# Patient Record
Sex: Male | Born: 1975 | Race: White | Hispanic: No | Marital: Married | Smoking: Current every day smoker
Health system: Southern US, Community
[De-identification: ages and names within clinical notes are randomized; demographics above are authoritative.]

## PROBLEM LIST (undated history)

## (undated) DIAGNOSIS — R51 Headache: Secondary | ICD-10-CM

## (undated) DIAGNOSIS — T4145XA Adverse effect of unspecified anesthetic, initial encounter: Secondary | ICD-10-CM

## (undated) DIAGNOSIS — Z9889 Other specified postprocedural states: Secondary | ICD-10-CM

## (undated) DIAGNOSIS — G709 Myoneural disorder, unspecified: Secondary | ICD-10-CM

## (undated) DIAGNOSIS — R112 Nausea with vomiting, unspecified: Secondary | ICD-10-CM

## (undated) DIAGNOSIS — M1990 Unspecified osteoarthritis, unspecified site: Secondary | ICD-10-CM

## (undated) DIAGNOSIS — K219 Gastro-esophageal reflux disease without esophagitis: Secondary | ICD-10-CM

## (undated) DIAGNOSIS — F419 Anxiety disorder, unspecified: Secondary | ICD-10-CM

## (undated) DIAGNOSIS — R519 Headache, unspecified: Secondary | ICD-10-CM

## (undated) DIAGNOSIS — T8859XA Other complications of anesthesia, initial encounter: Secondary | ICD-10-CM

## (undated) HISTORY — PX: APPENDECTOMY: SHX54

---

## 2000-05-15 HISTORY — PX: NASAL RECONSTRUCTION: SHX2069

## 2010-05-15 HISTORY — PX: HERNIA REPAIR: SHX51

## 2015-01-12 ENCOUNTER — Other Ambulatory Visit: Payer: Self-pay | Admitting: Neurological Surgery

## 2015-01-27 ENCOUNTER — Other Ambulatory Visit (HOSPITAL_COMMUNITY): Payer: Self-pay | Admitting: *Deleted

## 2015-01-28 ENCOUNTER — Encounter (HOSPITAL_COMMUNITY)
Admission: RE | Admit: 2015-01-28 | Discharge: 2015-01-28 | Disposition: A | Discharge status: Home / Self Care | Payer: BLUE CROSS/BLUE SHIELD | Source: Ambulatory Visit | Attending: Neurological Surgery | Admitting: Neurological Surgery

## 2015-01-28 ENCOUNTER — Encounter (HOSPITAL_COMMUNITY): Payer: Self-pay

## 2015-01-28 DIAGNOSIS — Z01812 Encounter for preprocedural laboratory examination: Secondary | ICD-10-CM | POA: Diagnosis present

## 2015-01-28 DIAGNOSIS — Z0183 Encounter for blood typing: Secondary | ICD-10-CM | POA: Insufficient documentation

## 2015-01-28 DIAGNOSIS — M4310 Spondylolisthesis, site unspecified: Secondary | ICD-10-CM | POA: Insufficient documentation

## 2015-01-28 HISTORY — DX: Headache, unspecified: R51.9

## 2015-01-28 HISTORY — DX: Gastro-esophageal reflux disease without esophagitis: K21.9

## 2015-01-28 HISTORY — DX: Myoneural disorder, unspecified: G70.9

## 2015-01-28 HISTORY — DX: Unspecified osteoarthritis, unspecified site: M19.90

## 2015-01-28 HISTORY — DX: Headache: R51

## 2015-01-28 HISTORY — DX: Other specified postprocedural states: Z98.890

## 2015-01-28 HISTORY — DX: Adverse effect of unspecified anesthetic, initial encounter: T41.45XA

## 2015-01-28 HISTORY — DX: Other complications of anesthesia, initial encounter: T88.59XA

## 2015-01-28 HISTORY — DX: Anxiety disorder, unspecified: F41.9

## 2015-01-28 HISTORY — DX: Nausea with vomiting, unspecified: R11.2

## 2015-01-28 LAB — CBC
HEMATOCRIT: 41 % (ref 39.0–52.0)
HEMOGLOBIN: 14.4 g/dL (ref 13.0–17.0)
MCH: 30.4 pg (ref 26.0–34.0)
MCHC: 35.1 g/dL (ref 30.0–36.0)
MCV: 86.5 fL (ref 78.0–100.0)
Platelets: 211 10*3/uL (ref 150–400)
RBC: 4.74 MIL/uL (ref 4.22–5.81)
RDW: 12.4 % (ref 11.5–15.5)
WBC: 6 10*3/uL (ref 4.0–10.5)

## 2015-01-28 LAB — COMPREHENSIVE METABOLIC PANEL
ALBUMIN: 4.2 g/dL (ref 3.5–5.0)
ALT: 34 U/L (ref 17–63)
ANION GAP: 6 (ref 5–15)
AST: 24 U/L (ref 15–41)
Alkaline Phosphatase: 81 U/L (ref 38–126)
BILIRUBIN TOTAL: 1.3 mg/dL — AB (ref 0.3–1.2)
BUN: 6 mg/dL (ref 6–20)
CO2: 27 mmol/L (ref 22–32)
Calcium: 9 mg/dL (ref 8.9–10.3)
Chloride: 107 mmol/L (ref 101–111)
Creatinine, Ser: 0.84 mg/dL (ref 0.61–1.24)
GFR calc Af Amer: >60 mL/min (ref >60)
GFR calc non Af Amer: >60 mL/min (ref >60)
GLUCOSE: 92 mg/dL (ref 65–99)
POTASSIUM: 3.9 mmol/L (ref 3.5–5.1)
SODIUM: 140 mmol/L (ref 135–145)
TOTAL PROTEIN: 7.3 g/dL (ref 6.5–8.1)

## 2015-01-28 LAB — TYPE AND SCREEN
ABO/RH(D): B POS
ANTIBODY SCREEN: NEGATIVE

## 2015-01-28 LAB — SURGICAL PCR SCREEN
MRSA, PCR: NEGATIVE
STAPHYLOCOCCUS AUREUS: NEGATIVE

## 2015-01-28 LAB — ABO/RH: ABO/RH(D): B POS

## 2015-01-28 NOTE — Pre-Procedure Instructions (Signed)
Gregory Sandoval  01/28/2015      [facility] - [place], [place] - [place] [address] [place] [address] Phone: [phone] Fax: [phone]    Your procedure is scheduled on 02/08/2015  Report to [facility] Admitting at 5:30 A.M.  Call this number if you have problems the morning of surgery:  [phone]   Remember:  Do not eat food or drink liquids after midnight.  On .  Place clean sheets on your  bed the night of your first shower and do not sleep with pets.  Day of Surgery  Do not apply any lotions/deodorants the morning of surgery.  Please wear clean clothes to the hospital/surgery center.  Please read over the following fact sheets that you were given. Pain Booklet, Coughing and Deep Breathing, Blood Transfusion Information, MRSA Information and Surgical Site Infection Prevention

## 2015-01-28 NOTE — Progress Notes (Addendum)
Pt. Was seen at [facility] in [place], [place] for Echo. 11/2013 , states that he had this test for the stress he was feeling , relative to chest pain.

## 2015-01-28 NOTE — Progress Notes (Signed)
PCP- Daganhart in [facility] in [place]. Pt. Reports that he feels chest pain "most of the time & that is why I ask for an Echo every once in a while, just to check on how my heart is doing".

## 2015-01-28 NOTE — Progress Notes (Signed)
Has been with pain management in the past in [place], on & off on Norco- for 10-31yrs. Pt. Reports that he has narcotics "stashed if I need it".

## 2015-02-07 ENCOUNTER — Encounter (HOSPITAL_COMMUNITY): Payer: Self-pay | Admitting: Certified Registered Nurse Anesthetist

## 2015-02-08 ENCOUNTER — Inpatient Hospital Stay (HOSPITAL_COMMUNITY): Payer: BLUE CROSS/BLUE SHIELD

## 2015-02-08 ENCOUNTER — Encounter (HOSPITAL_COMMUNITY)
Admission: RE | Disposition: A | Discharge status: Home / Self Care | Payer: BLUE CROSS/BLUE SHIELD | Source: Ambulatory Visit | Attending: Neurological Surgery

## 2015-02-08 ENCOUNTER — Encounter (HOSPITAL_COMMUNITY): Payer: Self-pay | Admitting: Emergency Medicine

## 2015-02-08 ENCOUNTER — Inpatient Hospital Stay (HOSPITAL_COMMUNITY): Payer: BLUE CROSS/BLUE SHIELD | Admitting: Certified Registered Nurse Anesthetist

## 2015-02-08 ENCOUNTER — Inpatient Hospital Stay (HOSPITAL_COMMUNITY)
Admission: RE | Admit: 2015-02-08 | Discharge: 2015-02-10 | DRG: 460 | Disposition: A | Discharge status: Home / Self Care | Payer: BLUE CROSS/BLUE SHIELD | Source: Ambulatory Visit | Attending: Neurological Surgery | Admitting: Neurological Surgery

## 2015-02-08 DIAGNOSIS — M549 Dorsalgia, unspecified: Secondary | ICD-10-CM | POA: Diagnosis present

## 2015-02-08 DIAGNOSIS — M5116 Intervertebral disc disorders with radiculopathy, lumbar region: Secondary | ICD-10-CM | POA: Diagnosis present

## 2015-02-08 DIAGNOSIS — M4316 Spondylolisthesis, lumbar region: Secondary | ICD-10-CM | POA: Diagnosis present

## 2015-02-08 DIAGNOSIS — Z419 Encounter for procedure for purposes other than remedying health state, unspecified: Secondary | ICD-10-CM

## 2015-02-08 SURGERY — POSTERIOR LUMBAR FUSION 1 LEVEL
Anesthesia: General | Site: Back

## 2015-02-08 MED ORDER — STERILE WATER FOR INJECTION IJ SOLN
INTRAMUSCULAR | Status: AC
  Filled 2015-02-08: qty 10

## 2015-02-08 MED ORDER — MIDAZOLAM HCL 2 MG/2ML IJ SOLN
INTRAMUSCULAR | Status: AC
  Filled 2015-02-08: qty 4

## 2015-02-08 MED ORDER — SODIUM CHLORIDE 0.9 % IJ SOLN
3.0000 mL | Freq: Two times a day (BID) | INTRAMUSCULAR | Status: DC
  Administered 2015-02-09 – 2015-02-10 (×3): 3 mL via INTRAVENOUS

## 2015-02-08 MED ORDER — SODIUM CHLORIDE 0.9 % IV SOLN: INTRAVENOUS | Status: DC

## 2015-02-08 MED ORDER — ONDANSETRON HCL 4 MG/2ML IJ SOLN: 4.0000 mg | INTRAMUSCULAR | Status: DC | PRN

## 2015-02-08 MED ORDER — MIDAZOLAM HCL 5 MG/5ML IJ SOLN
INTRAMUSCULAR | Status: DC | PRN
Start: 2015-02-08 — End: 2015-02-08
  Administered 2015-02-08: 2 mg via INTRAVENOUS

## 2015-02-08 MED ORDER — HYDROCODONE-ACETAMINOPHEN 10-325 MG PO TABS: 1.0000 | ORAL_TABLET | Freq: Four times a day (QID) | ORAL | Status: DC | PRN

## 2015-02-08 MED ORDER — LIDOCAINE HCL (CARDIAC) 20 MG/ML IV SOLN
INTRAVENOUS | Status: DC | PRN
  Administered 2015-02-08: 100 mg via INTRAVENOUS

## 2015-02-08 MED ORDER — DEXAMETHASONE SODIUM PHOSPHATE 10 MG/ML IJ SOLN
INTRAMUSCULAR | Status: AC
  Filled 2015-02-08: qty 1

## 2015-02-08 MED ORDER — SURGIFOAM 100 EX MISC
CUTANEOUS | Status: DC | PRN
  Administered 2015-02-08: 08:00:00 via TOPICAL

## 2015-02-08 MED ORDER — LACTATED RINGERS IV SOLN
INTRAVENOUS | Status: DC | PRN
  Administered 2015-02-08 (×3): via INTRAVENOUS

## 2015-02-08 MED ORDER — PROMETHAZINE HCL 25 MG/ML IJ SOLN
6.2500 mg | INTRAMUSCULAR | Status: DC | PRN
  Administered 2015-02-08: 6.25 mg via INTRAVENOUS

## 2015-02-08 MED ORDER — SUFENTANIL CITRATE 50 MCG/ML IV SOLN
INTRAVENOUS | Status: DC | PRN
  Administered 2015-02-08 (×4): 5 ug via INTRAVENOUS

## 2015-02-08 MED ORDER — ACETAMINOPHEN 325 MG PO TABS: 650.0000 mg | ORAL_TABLET | ORAL | Status: DC | PRN

## 2015-02-08 MED ORDER — HYDROMORPHONE HCL 1 MG/ML IJ SOLN: 0.2500 mg | INTRAMUSCULAR | Status: DC | PRN

## 2015-02-08 MED ORDER — SUCCINYLCHOLINE CHLORIDE 20 MG/ML IJ SOLN
INTRAMUSCULAR | Status: AC
  Filled 2015-02-08: qty 1

## 2015-02-08 MED ORDER — CEFAZOLIN SODIUM 1-5 GM-% IV SOLN
1.0000 g | Freq: Three times a day (TID) | INTRAVENOUS | Status: AC
  Administered 2015-02-08 (×2): 1 g via INTRAVENOUS
  Filled 2015-02-08 (×2): qty 50

## 2015-02-08 MED ORDER — SUFENTANIL CITRATE 50 MCG/ML IV SOLN
INTRAVENOUS | Status: AC
  Filled 2015-02-08: qty 1

## 2015-02-08 MED ORDER — BUPIVACAINE HCL (PF) 0.5 % IJ SOLN
INTRAMUSCULAR | Status: DC | PRN
  Administered 2015-02-08: 10 mL
  Administered 2015-02-08: 25 mL

## 2015-02-08 MED ORDER — HYDROCODONE-ACETAMINOPHEN 5-325 MG PO TABS
1.0000 | ORAL_TABLET | ORAL | Status: DC | PRN
  Filled 2015-02-08: qty 1

## 2015-02-08 MED ORDER — ROCURONIUM BROMIDE 50 MG/5ML IV SOLN
INTRAVENOUS | Status: AC
  Filled 2015-02-08: qty 3

## 2015-02-08 MED ORDER — ONDANSETRON HCL 4 MG/2ML IJ SOLN
INTRAMUSCULAR | Status: DC | PRN
  Administered 2015-02-08: 4 mg via INTRAVENOUS

## 2015-02-08 MED ORDER — PROPOFOL 10 MG/ML IV BOLUS
INTRAVENOUS | Status: AC
  Filled 2015-02-08: qty 20

## 2015-02-08 MED ORDER — BISACODYL 10 MG RE SUPP: 10.0000 mg | Freq: Every day | RECTAL | Status: DC | PRN

## 2015-02-08 MED ORDER — SUCCINYLCHOLINE CHLORIDE 20 MG/ML IJ SOLN
INTRAMUSCULAR | Status: DC | PRN
  Administered 2015-02-08: 120 mg via INTRAVENOUS

## 2015-02-08 MED ORDER — METHOCARBAMOL 500 MG PO TABS
500.0000 mg | ORAL_TABLET | Freq: Four times a day (QID) | ORAL | Status: DC | PRN
  Administered 2015-02-08 – 2015-02-09 (×3): 500 mg via ORAL
  Filled 2015-02-08 (×3): qty 1

## 2015-02-08 MED ORDER — ALPRAZOLAM 0.5 MG PO TABS
1.0000 mg | ORAL_TABLET | Freq: Three times a day (TID) | ORAL | Status: DC | PRN
  Administered 2015-02-08 – 2015-02-09 (×2): 1 mg via ORAL
  Filled 2015-02-08 (×2): qty 2

## 2015-02-08 MED ORDER — EPHEDRINE SULFATE 50 MG/ML IJ SOLN
INTRAMUSCULAR | Status: AC
  Filled 2015-02-08: qty 1

## 2015-02-08 MED ORDER — SUGAMMADEX SODIUM 500 MG/5ML IV SOLN
INTRAVENOUS | Status: AC
  Filled 2015-02-08: qty 5

## 2015-02-08 MED ORDER — SENNA 8.6 MG PO TABS
1.0000 | ORAL_TABLET | Freq: Two times a day (BID) | ORAL | Status: DC
  Administered 2015-02-10: 8.6 mg via ORAL
  Filled 2015-02-08 (×3): qty 1

## 2015-02-08 MED ORDER — CEFAZOLIN SODIUM-DEXTROSE 2-3 GM-% IV SOLR
2.0000 g | INTRAVENOUS | Status: AC
  Administered 2015-02-08: 3 g via INTRAVENOUS

## 2015-02-08 MED ORDER — PANTOPRAZOLE SODIUM 40 MG PO TBEC
40.0000 mg | DELAYED_RELEASE_TABLET | Freq: Every day | ORAL | Status: DC
  Administered 2015-02-09 – 2015-02-10 (×2): 40 mg via ORAL
  Filled 2015-02-08 (×2): qty 1

## 2015-02-08 MED ORDER — ROCURONIUM BROMIDE 50 MG/5ML IV SOLN
INTRAVENOUS | Status: AC
  Filled 2015-02-08: qty 1

## 2015-02-08 MED ORDER — PHENOL 1.4 % MT LIQD: 1.0000 | OROMUCOSAL | Status: DC | PRN

## 2015-02-08 MED ORDER — PROPOFOL 10 MG/ML IV BOLUS
INTRAVENOUS | Status: DC | PRN
  Administered 2015-02-08: 70 mg via INTRAVENOUS
  Administered 2015-02-08: 20 mg via INTRAVENOUS
  Administered 2015-02-08: 200 mg via INTRAVENOUS

## 2015-02-08 MED ORDER — 0.9 % SODIUM CHLORIDE (POUR BTL) OPTIME
TOPICAL | Status: DC | PRN
  Administered 2015-02-08: 1000 mL

## 2015-02-08 MED ORDER — DOCUSATE SODIUM 100 MG PO CAPS
100.0000 mg | ORAL_CAPSULE | Freq: Two times a day (BID) | ORAL | Status: DC
  Administered 2015-02-09 – 2015-02-10 (×2): 100 mg via ORAL
  Filled 2015-02-08 (×3): qty 1

## 2015-02-08 MED ORDER — ACETAMINOPHEN 650 MG RE SUPP: 650.0000 mg | RECTAL | Status: DC | PRN

## 2015-02-08 MED ORDER — ROCURONIUM BROMIDE 100 MG/10ML IV SOLN
INTRAVENOUS | Status: DC | PRN
  Administered 2015-02-08 (×2): 50 mg via INTRAVENOUS
  Administered 2015-02-08: 40 mg via INTRAVENOUS

## 2015-02-08 MED ORDER — FENTANYL CITRATE (PF) 100 MCG/2ML IJ SOLN
INTRAMUSCULAR | Status: DC | PRN
  Administered 2015-02-08: 150 ug via INTRAVENOUS
  Administered 2015-02-08: 100 ug via INTRAVENOUS

## 2015-02-08 MED ORDER — OXYCODONE-ACETAMINOPHEN 5-325 MG PO TABS
1.0000 | ORAL_TABLET | ORAL | Status: DC | PRN
Start: 2015-02-08 — End: 2015-02-10
  Administered 2015-02-08 – 2015-02-10 (×10): 2 via ORAL
  Filled 2015-02-08 (×11): qty 2

## 2015-02-08 MED ORDER — FLEET ENEMA 7-19 GM/118ML RE ENEM: 1.0000 | ENEMA | Freq: Once | RECTAL | Status: DC | PRN

## 2015-02-08 MED ORDER — METHOCARBAMOL 1000 MG/10ML IJ SOLN
500.0000 mg | Freq: Four times a day (QID) | INTRAVENOUS | Status: DC | PRN
  Filled 2015-02-08: qty 5

## 2015-02-08 MED ORDER — FENTANYL CITRATE (PF) 250 MCG/5ML IJ SOLN
INTRAMUSCULAR | Status: AC
  Filled 2015-02-08: qty 5

## 2015-02-08 MED ORDER — THROMBIN 5000 UNITS EX SOLR
OROMUCOSAL | Status: DC | PRN
  Administered 2015-02-08: 08:00:00 via TOPICAL

## 2015-02-08 MED ORDER — SUGAMMADEX SODIUM 500 MG/5ML IV SOLN
INTRAVENOUS | Status: DC | PRN
  Administered 2015-02-08: 500 mg via INTRAVENOUS

## 2015-02-08 MED ORDER — PROMETHAZINE HCL 25 MG/ML IJ SOLN
INTRAMUSCULAR | Status: AC
  Filled 2015-02-08: qty 1

## 2015-02-08 MED ORDER — SODIUM CHLORIDE 0.9 % IJ SOLN: 3.0000 mL | INTRAMUSCULAR | Status: DC | PRN

## 2015-02-08 MED ORDER — MENTHOL 3 MG MT LOZG: 1.0000 | LOZENGE | OROMUCOSAL | Status: DC | PRN

## 2015-02-08 MED ORDER — SODIUM CHLORIDE 0.9 % IV SOLN: 250.0000 mL | INTRAVENOUS | Status: DC

## 2015-02-08 MED ORDER — DEXAMETHASONE SODIUM PHOSPHATE 4 MG/ML IJ SOLN
INTRAMUSCULAR | Status: DC | PRN
  Administered 2015-02-08: 10 mg via INTRAVENOUS

## 2015-02-08 MED ORDER — SODIUM CHLORIDE 0.9 % IR SOLN
Status: DC | PRN
  Administered 2015-02-08: 08:00:00

## 2015-02-08 MED ORDER — LIDOCAINE-EPINEPHRINE 1 %-1:100000 IJ SOLN
INTRAMUSCULAR | Status: DC | PRN
  Administered 2015-02-08: 10 mL

## 2015-02-08 MED ORDER — POLYETHYLENE GLYCOL 3350 17 G PO PACK: 17.0000 g | PACK | Freq: Every day | ORAL | Status: DC | PRN

## 2015-02-08 MED FILL — Heparin Sodium (Porcine) Inj 1000 Unit/ML: INTRAMUSCULAR | Qty: 30 | Status: AC

## 2015-02-08 MED FILL — Sodium Chloride IV Soln 0.9%: INTRAVENOUS | Qty: 1000 | Status: AC

## 2015-02-08 SURGICAL SUPPLY — 62 items
BAG DECANTER FOR FLEXI CONT (MISCELLANEOUS) ×3 IMPLANT
BLADE CLIPPER SURG (BLADE) IMPLANT
BONE MATRIX OSTEOCEL PRO MED (Bone Implant) ×3 IMPLANT
BUR MATCHSTICK NEURO 3.0 LAGG (BURR) ×3 IMPLANT
CAGE COROENT LRG MP 8X9X28-12 (Cage) ×6 IMPLANT
CANISTER SUCT 3000ML PPV (MISCELLANEOUS) ×3 IMPLANT
CONT SPEC 4OZ CLIKSEAL STRL BL (MISCELLANEOUS) ×3 IMPLANT
COVER BACK TABLE 60X90IN (DRAPES) ×3 IMPLANT
DECANTER SPIKE VIAL GLASS SM (MISCELLANEOUS) ×3 IMPLANT
DERMABOND ADVANCED (GAUZE/BANDAGES/DRESSINGS) ×2
DERMABOND ADVANCED .7 DNX12 (GAUZE/BANDAGES/DRESSINGS) ×1 IMPLANT
DRAPE C-ARM 42X72 X-RAY (DRAPES) ×6 IMPLANT
DRAPE LAPAROTOMY 100X72X124 (DRAPES) ×3 IMPLANT
DRAPE POUCH INSTRU U-SHP 10X18 (DRAPES) ×3 IMPLANT
DRAPE PROXIMA HALF (DRAPES) ×3 IMPLANT
DRSG OPSITE POSTOP 4X6 (GAUZE/BANDAGES/DRESSINGS) ×3 IMPLANT
DURAPREP 26ML APPLICATOR (WOUND CARE) ×3 IMPLANT
ELECT BLADE 4.0 EZ CLEAN MEGAD (MISCELLANEOUS) ×3
ELECT REM PT RETURN 9FT ADLT (ELECTROSURGICAL) ×3
ELECTRODE BLDE 4.0 EZ CLN MEGD (MISCELLANEOUS) ×1 IMPLANT
ELECTRODE REM PT RTRN 9FT ADLT (ELECTROSURGICAL) ×1 IMPLANT
GAUZE SPONGE 4X4 12PLY STRL (GAUZE/BANDAGES/DRESSINGS) ×3 IMPLANT
GAUZE SPONGE 4X4 16PLY XRAY LF (GAUZE/BANDAGES/DRESSINGS) ×3 IMPLANT
GLOVE BIOGEL PI IND STRL 8.5 (GLOVE) ×2 IMPLANT
GLOVE BIOGEL PI INDICATOR 8.5 (GLOVE) ×4
GLOVE ECLIPSE 8.5 STRL (GLOVE) ×6 IMPLANT
GLOVE EXAM NITRILE LRG STRL (GLOVE) IMPLANT
GLOVE EXAM NITRILE MD LF STRL (GLOVE) IMPLANT
GLOVE EXAM NITRILE XL STR (GLOVE) IMPLANT
GLOVE EXAM NITRILE XS STR PU (GLOVE) IMPLANT
GOWN STRL REUS W/ TWL LRG LVL3 (GOWN DISPOSABLE) IMPLANT
GOWN STRL REUS W/ TWL XL LVL3 (GOWN DISPOSABLE) IMPLANT
GOWN STRL REUS W/TWL 2XL LVL3 (GOWN DISPOSABLE) ×6 IMPLANT
GOWN STRL REUS W/TWL LRG LVL3 (GOWN DISPOSABLE)
GOWN STRL REUS W/TWL XL LVL3 (GOWN DISPOSABLE)
HEMOSTAT POWDER KIT SURGIFOAM (HEMOSTASIS) IMPLANT
HEMOSTAT POWDER SURGIFOAM 1G (HEMOSTASIS) ×3 IMPLANT
KIT BASIN OR (CUSTOM PROCEDURE TRAY) ×3 IMPLANT
KIT ROOM TURNOVER OR (KITS) ×3 IMPLANT
MILL MEDIUM DISP (BLADE) ×3 IMPLANT
NEEDLE HYPO 22GX1.5 SAFETY (NEEDLE) ×3 IMPLANT
NS IRRIG 1000ML POUR BTL (IV SOLUTION) ×3 IMPLANT
PACK FOAM VITOSS 10CC (Orthopedic Implant) ×3 IMPLANT
PACK LAMINECTOMY NEURO (CUSTOM PROCEDURE TRAY) ×3 IMPLANT
PAD ARMBOARD 7.5X6 YLW CONV (MISCELLANEOUS) ×9 IMPLANT
PATTIES SURGICAL .5 X1 (DISPOSABLE) ×3 IMPLANT
ROD RELINE-O LORD 5.5X40 (Rod) ×6 IMPLANT
SCREW LOCK RELINE 5.5 TULIP (Screw) ×12 IMPLANT
SCREW RELINE-O POLY 6.5X45 (Screw) ×12 IMPLANT
SPONGE LAP 4X18 X RAY DECT (DISPOSABLE) IMPLANT
SPONGE SURGIFOAM ABS GEL 100 (HEMOSTASIS) ×3 IMPLANT
SUT VIC AB 1 CT1 18XBRD ANBCTR (SUTURE) ×1 IMPLANT
SUT VIC AB 1 CT1 8-18 (SUTURE) ×2
SUT VIC AB 2-0 CP2 18 (SUTURE) ×3 IMPLANT
SUT VIC AB 3-0 SH 8-18 (SUTURE) ×6 IMPLANT
SYR 3ML LL SCALE MARK (SYRINGE) ×12 IMPLANT
TOWEL OR 17X24 6PK STRL BLUE (TOWEL DISPOSABLE) ×3 IMPLANT
TOWEL OR 17X26 10 PK STRL BLUE (TOWEL DISPOSABLE) ×3 IMPLANT
TRAP SPECIMEN MUCOUS 40CC (MISCELLANEOUS) ×3 IMPLANT
TRAY FOLEY CATH 16FRSI W/METER (SET/KITS/TRAYS/PACK) ×3 IMPLANT
TRAY FOLEY W/METER SILVER 14FR (SET/KITS/TRAYS/PACK) IMPLANT
WATER STERILE IRR 1000ML POUR (IV SOLUTION) ×3 IMPLANT

## 2015-02-08 NOTE — Anesthesia Postprocedure Evaluation (Signed)
  Anesthesia Post-op Note  Patient: Gregory Sandoval  Procedure(s) Performed: Procedure(s) with comments: Lumbar four to five Posterior Lumbar interbody fusion (N/A) - L4-5 Posterior lumbar interbody fusion  Patient Location: PACU  Anesthesia Type:General  Level of Consciousness: awake and alert   Airway and Oxygen Therapy: Patient Spontanous Breathing  Post-op Pain: mild  Post-op Assessment: Post-op Vital signs reviewed              Post-op Vital Signs: stable  Last Vitals:  Filed Vitals:   02/08/15 1239  BP: 132/83  Pulse: 93  Temp: 36.9 C  Resp: 18    Complications: No apparent anesthesia complications

## 2015-02-08 NOTE — Anesthesia Preprocedure Evaluation (Addendum)
Anesthesia Evaluation  Patient identified by MRN, date of birth, ID band Patient awake    Reviewed: Allergy & Precautions, NPO status , Patient's Chart, lab work & pertinent test results  Airway Mallampati: III  TM Distance: >3 FB Neck ROM: Full    Dental  (+) Teeth Intact, Dental Advisory Given   Pulmonary Current Smoker,    breath sounds clear to auscultation       Cardiovascular negative cardio ROS   Rhythm:Regular Rate:Normal     Neuro/Psych  Headaches,  Neuromuscular disease    GI/Hepatic Neg liver ROS, GERD  ,  Endo/Other  negative endocrine ROSMorbid obesity  Renal/GU negative Renal ROS     Musculoskeletal   Abdominal (+) + obese,   Peds  Hematology   Anesthesia Other Findings   Reproductive/Obstetrics                            Anesthesia Physical Anesthesia Plan  ASA: II  Anesthesia Plan: General   Post-op Pain Management:    Induction: Intravenous  Airway Management Planned: Oral ETT and Video Laryngoscope Planned  Additional Equipment:   Intra-op Plan:   Post-operative Plan: Extubation in OR  Informed Consent: I have reviewed the patients History and Physical, chart, labs and discussed the procedure including the risks, benefits and alternatives for the proposed anesthesia with the patient or authorized representative who has indicated his/her understanding and acceptance.     Plan Discussed with:   Anesthesia Plan Comments:        Anesthesia Quick Evaluation

## 2015-02-08 NOTE — H&P (Signed)
CHIEF COMPLAINT:                                          Back pain, bilateral lower extremity pain with spasms in the right leg and pain in the left leg since injury in 03/2013.  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  He was dealing with this for several days before he was finally evaluated by a physician and he was initially treated with some medication and some rest but things continued to worsen for him.  He was treated with physical therapy which had minimal effect and ultimately he was seen by a physiatrist and more physical therapy was recommended.  He then had a series of two injections and an ablation of the facets in his lumbar spine.  He was then rated and released.  This was in 08/2014.  He subsequently continued to have back pain to a greater or lesser extent and he was having right foot pain, which he was told that ultimately was not related to his back and he would have to be evaluated separately.  He saw an orthopedist on his own accord and he was diagnosed with plantar fasciitis but it was the treating physician's feeling that the plantar fasciitis was indeed related to his work-related injury.  Because he continued to have both back pain and the foot pain he was put on light duty activity but he was not able to work with the restrictions that he was given and he went out of the workplace in May to allow this process to heal.  Nonetheless, his back pain and leg pain continued and he started having worsening left leg pain that ultimately resulted in a repeat MRI being performed in July of this year.  This study is reviewed in the office today and it demonstrates that a previously noted disk bulge at L4-L5 is now a disk herniation on the left side at L4-L5.  There is a slight suggestion that there may be an anterolisthesis in this region  and today in the office I obtained lateral standing flexion/extension films which demonstrate that the patient has approximately 3 mm of anterolisthesis at L4 and L5 which reduces with flexion.  The patient is now seen for further evaluation of this process as he has had significant problems with spasm that persist in the right lower extremity and pain in the left lower extremity.    REVIEW OF SYSTEMS:                                    Systems review is notable for leg weakness, back pain, leg pain, ringing in the ears, balance disturbance, shortness of breath, some anxiety, difficulty with coordination of his arms and his legs and some memory issues.   PAST MEDICAL HISTORY:                                His past medical history reveals that his general health has been good.  He reports  no significant medical problems.  . Prior Operations:  Previous surgeries include an appendectomy, a hernia repair and some nasal reconstruction done around 2002.  . Medications and Allergies:  HE NOTES AN ALLERGY TO ADVIL, NAPROSYN AND OTHER ANTIINFLAMMATORIES, WHICH CAUSE RASH, ITCHING AND SWELLING.  Current medications include omeprazole 20 mg a day, alprazolam 1 mg a day as needed up to three times a day, oxycodone and diazepam.  SOCIAL HISTORY:                                            Personal history:  He does not smoke or use alcohol.    PHYSICAL EXAMINATION:                                He has had about a 20-pound weight gain because of inactivity and currently is at 6 feet 2 inches and 265 pounds.  NEUROLOGICAL EXAMINATION:                       On physical examination he stands straight and erect.   He has a moderate amount of paravertebral spasm when he stands and he tends to favor a slight forward stoop.  His motor function is good in the iliopsoas; however, the quad on the left side and the tibialis anterior on the left side are both weak to 4/5.  His straight leg raising is markedly positive on the  left side at 15 degrees.  Patrick maneuver is negative bilaterally.  His deep tendon reflex is diminished in the patella on the left compared to the right at 1+ and Achilles reflexes are 2+ bilaterally.  His ability to walk onto his heels is diminished on that left lower extremity compared to the right side.  IMPRESSION:                                                   The patient has evidence of a left-sided disk herniation at the L4-L5 level with some moderate disk degenerative changes at that disk itself.  I note that his plain x-rays demonstrate the presence of about a 3 mm anterolisthesis between flexion and extension at that level.  I described the findings to the patient, his wife and the nurse who is present in the room.  I indicated that given the duration of his symptoms and chronic worsening with the passage of time I believe that ultimately he will require surgical decompression and stabilization of the L4-L5 joint in order to achieve some reasonable state of relief.  I did note that he has some adjacent level degenerative changes at L3-4 and L4-5 but the L4-5 joint is clearly where he is developing the worst of his radicular syndrome.  I suggested that he may benefit from a transforaminal epidural steroid injection at L4-L5 in an effort to block both the L4 and the L5 nerve roots.  This may give him some good transient relief but if the pain persists to any substantial degree then ultimately surgical decompression and stabilization of the joint is my recommendation for him.  The process has now been going on for over a year's period  of time without much resolution and persistent ongoing pain

## 2015-02-08 NOTE — Progress Notes (Signed)
Pt arrived from PACU via stretcher. Transferred to unit bed. Oriented to unit and call light. Will proceed with managing pain and completing admission documentation. Lawson Radar

## 2015-02-08 NOTE — Progress Notes (Signed)
Patient ID: Gregory Sandoval, male   DOB: 06-18-1975, 39 y.o.   MRN: [id number] Vital signs are stable Incision is clean and dry on his back Patient is ambulating, motor strength appears intact in both lower extremities.

## 2015-02-08 NOTE — Anesthesia Procedure Notes (Signed)
Procedure Name: Intubation Date/Time: 02/08/2015 7:45 AM Performed by: Reine Just Pre-anesthesia Checklist: Patient identified, Emergency Drugs available, Suction available, Patient being monitored and Timeout performed Patient Re-evaluated:Patient Re-evaluated prior to inductionOxygen Delivery Method: Circle system utilized and Simple face mask Preoxygenation: Pre-oxygenation with 100% oxygen Intubation Type: IV induction Laryngoscope Size: Glidescope and 4 Grade View: Grade I Tube type: Oral Tube size: 7.5 mm Number of attempts: 1 Airway Equipment and Method: Patient positioned with wedge pillow,  Stylet and Video-laryngoscopy Placement Confirmation: ETT inserted through vocal cords under direct vision,  positive ETCO2 and breath sounds checked- equal and bilateral Secured at: 23 cm Tube secured with: Tape Dental Injury: Teeth and Oropharynx as per pre-operative assessment

## 2015-02-08 NOTE — Transfer of Care (Signed)
Immediate Anesthesia Transfer of Care Note  Patient: Gregory Sandoval  Procedure(s) Performed: Procedure(s) with comments: Lumbar four to five Posterior Lumbar interbody fusion (N/A) - L4-5 Posterior lumbar interbody fusion  Patient Location: PACU  Anesthesia Type:General  Level of Consciousness: awake, alert  and oriented  Airway & Oxygen Therapy: Patient Spontanous Breathing and Patient connected to nasal cannula oxygen  Post-op Assessment: Report given to RN, Post -op Vital signs reviewed and stable and Patient moving all extremities X 4  Post vital signs: Reviewed and stable  Last Vitals:  Filed Vitals:   02/08/15 1129  BP: 125/70  Pulse: 107  Temp: 36.6 C  Resp:     Complications: No apparent anesthesia complications

## 2015-02-08 NOTE — Evaluation (Signed)
Physical Therapy Evaluation Patient Details Name: Gregory Sandoval MRN: [id number] DOB: 06/20/1975 Today's Date: 02/08/2015   History of Present Illness  S/p L4-L5 decompressive laminectomy decompression of L4 and L5 nerve roots on 02/08/15  Clinical Impression  Patient in bed, agreeable to participate in PT today once awoken. Patient moved very slowly throughout session, was difficult to keep him on task and at a reasonable pace. He has a lot of questions regarding his back. Patient was able to ambulate and transfer as described below. He and his wife were educated on back precautions and a handout was given. Patient will benefit from continued PT to improve gait speed and steadiness to allow him to return to an active lifestyle without pain.    Follow Up Recommendations Outpatient PT;Supervision for mobility/OOB    Equipment Recommendations  Rolling walker with 5" wheels    Recommendations for Other Services       Precautions / Restrictions Precautions Precautions: Back Precaution Booklet Issued: Yes (comment) Required Braces or Orthoses: Spinal Brace (Can be applied in sitting per MD.) Spinal Brace: Lumbar corset;Applied in sitting position Restrictions Weight Bearing Restrictions: No      Mobility  Bed Mobility Overal bed mobility: Needs Assistance Bed Mobility: Rolling;Sidelying to Sit Rolling: Min assist Sidelying to sit: Mod assist       General bed mobility comments: Max VC's for rolling technique. Patient confused and frustrated. Mod A for sidelying to sit and min A for sequencing of rolling.  Transfers Overall transfer level: Modified independent Equipment used: Rolling walker (2 wheeled)             General transfer comment: Able to stand without physical assistance using bed rails and from elevated surface.  Ambulation/Gait Ambulation/Gait assistance: Supervision Ambulation Distance (Feet): 600 Feet Assistive device: Rolling walker (2 wheeled) Gait  Pattern/deviations: Step-through pattern;Decreased stride length;Shuffle Gait velocity: Very slow. Gait velocity interpretation: Below normal speed for age/gender General Gait Details: Exceedingly slow gait speed. Patient determined to walk from [facility] to 5N despite educating patient on not overdoing it the first day. Was able to moderately pick up pace with VC's but was quick to stop and talk, or at least slow down and talk.  Stairs            Wheelchair Mobility    Modified Rankin (Stroke Patients Only)       Balance Overall balance assessment: Needs assistance         Standing balance support: Bilateral upper extremity supported Standing balance-Leahy Scale: Fair Standing balance comment: Use of RW for standing balance.                             Pertinent Vitals/Pain Pain Assessment: No/denies pain (Says catheter is uncomfortable, general incision soreness)    Home Living Family/patient expects to be discharged to:: Private residence Living Arrangements: Spouse/significant other Available Help at Discharge: Family;Available PRN/intermittently Type of Home: House Home Access: Stairs to enter   Entrance Stairs-Number of Steps: 1 Home Layout: One level Home Equipment: Walker - 2 wheels;Crutches;Toilet riser;Grab bars - toilet      Prior Function Level of Independence: Needs assistance         Comments: Was not walking much prior to surgery.     Hand Dominance   Dominant Hand: Right    Extremity/Trunk Assessment   Upper Extremity Assessment: Overall WFL for tasks assessed           Lower  Extremity Assessment: Generalized weakness         Communication   Communication: No difficulties  Cognition Arousal/Alertness: Lethargic Behavior During Therapy: WFL for tasks assessed/performed Overall Cognitive Status: Within Functional Limits for tasks assessed                      General Comments      Exercises         Assessment/Plan    PT Assessment Patient needs continued PT services  PT Diagnosis Difficulty walking;Generalized weakness;Acute pain   PT Problem List Decreased strength;Decreased activity tolerance;Decreased balance;Decreased mobility;Decreased knowledge of use of DME  PT Treatment Interventions DME instruction;Gait training;Stair training;Functional mobility training;Therapeutic activities;Therapeutic exercise;Patient/family education   PT Goals (Current goals can be found in the Care Plan section) Acute Rehab PT Goals Patient Stated Goal: Get better, return to being active. PT Goal Formulation: With patient Time For Goal Achievement: 02/22/15 Potential to Achieve Goals: Good    Frequency Min 5X/week   Barriers to discharge        Co-evaluation               End of Session Equipment Utilized During Treatment: Back brace Activity Tolerance: Patient tolerated treatment well;No increased pain Patient left: in chair;with call bell/phone within reach;with family/visitor present Nurse Communication: Mobility status;Patient requests pain meds         Time: 1430-1539 PT Time Calculation (min) (ACUTE ONLY): 69 min   Charges:   PT Evaluation $Initial PT Evaluation Tier I: 1 Procedure PT Treatments $Gait Training: 23-37 mins $Self Care/Home Management: 23-37   PT G CodesMichele Rockers, SPT [phone] 02/08/2015, 4:48 PM  I have read, reviewed and agree with student's note.   [facility] [phone] (pager)

## 2015-02-08 NOTE — Op Note (Signed)
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  Asst.: Dr. Romeo Apple Ditty  Indications: Patient is Gregory Sandoval is a 39 y.o. male who who's had significant back pain and lumbar radiculopathy for over a years period time. A lumbar myelogram demonstrates advanced spondylolisthesis with high-grade canal stenosis. he was advised regarding surgical intervention.  Procedure: The patient was brought to the operating room supine on a stretcher. After the smooth induction of general endotracheal anesthesia she was turned prone and the back was prepped with alcohol and DuraPrep. The back was then draped sterilely. A midline incision was created and carried down to the lumbar dorsal fascia. A localizing radiograph identified the L4 and L5 spinous processes. A subligamentous dissection was created at L4 and L5 to expose the interlaminar space at L4 and L5 and the facet joints over the L4-L5 interspace. Laminotomies were were then created removing the entire inferior margin of the lamina of L4 including the inferior facet at the L4-L5 joint. The yellow ligament was taken up and the common dural tube was exposed along with the L4 nerve root superiorly, and the L5 nerve root inferiorly, the disc space was exposed and epidural veins in this region were cauterized and divided. The L4 nerve roots and the L5 nerve root were dissected with care taken to protect them. The disc space was opened and a combination of curettes and rongeurs was used to evacuate the disc space  fully. The endplates were removed using sharp curettes. An interbody spacer was placed to distract the disc space while the contralateral discectomy was performed. When the entirety of the disc was removed and the endplates were prepared final sizing of the disc space was obtained 12 degrees lordotic 10 mm tall 28 mm long peek spacers were chosen and packed with autograft and allograft and placed into the interspace. The remainder of the interspace was packed with autograft and allograft for a total of 9 mL of bone graft. Pedicle entry sites were then chosen using fluoroscopic guidance and 6.5 x 45 mm screws were placed in L4 and 6.5 x 45 mm screws were placed in L5. The lateral gutters were decorticated and graft was packed in the posterolateral gutters between L4 and L5. Final radiographs were obtained after placing appropriately sized rods between the pedicle screws at L4-L5 and torquing these to the appropriate tension. The surgical site was inspected carefully to assure the L4 and L5 nerve roots were well decompressed, hemostasis was obtained, and the graft was well packed. Then the retractors were removed and the wound was closed with #1 Vicryl in the lumbar dorsal fascia 2-0 Vicryl in the subcutaneous tissue and 3-0 Vicryl subcuticularly. When he cc of half percent Marcaine was injected into the paraspinous musculature at the time of closure. Blood loss was estimated at 250 cc. The patient tolerated procedure well and was returned to the recovery room in stable condition.

## 2015-02-09 NOTE — Progress Notes (Signed)
Patient ID: Gregory Sandoval, male   DOB: 1975/07/17, 39 y.o.   MRN: [id number] Vital signs are stable postop day 1 Has had some difficulty with voiding Now seems to be doing better Incision is clean and dry Motor function is intact

## 2015-02-09 NOTE — Care Management Note (Signed)
Case Management Note  Patient Details  Name: Gregory Sandoval MRN: [id number] Date of Birth: 12/26/1975  Subjective/Objective:                    Action/Plan: Patient admitted for a L 4-5 PLIF. Patient is from home with spouse. CM will continue to follow for discharge needs.  Expected Discharge Date:                  Expected Discharge Plan:     In-House Referral:     Discharge planning Services     Post Acute Care Choice:    Choice offered to:     DME Arranged:    DME Agency:     HH Arranged:    HH Agency:     Status of Service:     Medicare Important Message Given:    Date Medicare IM Given:    Medicare IM give by:    Date Additional Medicare IM Given:    Additional Medicare Important Message give by:     If discussed at Long Length of Stay Meetings, dates discussed:    Additional Comments:  Kermit Balo, RN 02/09/2015, 10:22 AM

## 2015-02-09 NOTE — Progress Notes (Signed)
Occupational Therapy Evaluation Patient Details Name: Gregory Sandoval MRN: [id number] DOB: Dec 23, 1975 Today's Date: 02/09/2015    History of Present Illness S/p L4-L5 decompressive laminectomy decompression of L4 and L5 nerve roots on 02/08/15   Clinical Impression   Pt admitted with the above diagnoses and presents with below problem list. Pt will benefit from continued acute  OT to address the below listed deficits and maximize independence with BADLs prior to d/c home. Pt is at min A level for most ADLs; mod A for bed mobility. Session details below. ADL education provided including AE, compensatory strategies, and home setup. OT to continue to follow acutely.      Follow Up Recommendations  No OT follow up;Supervision - Intermittent;Other (comment) (OOB/mobility)    Equipment Recommendations  3 in 1 bedside comode    Recommendations for Other Services       Precautions / Restrictions Precautions Precautions: Back Required Braces or Orthoses: Spinal Brace ((Can be applied in sitting per MD.)) Spinal Brace: Lumbar corset;Applied in sitting position Restrictions Weight Bearing Restrictions: No      Mobility Bed Mobility Overal bed mobility: Needs Assistance Bed Mobility: Rolling;Sidelying to Sit;Sit to Sidelying Rolling: Min guard Sidelying to sit: Min guard     Sit to sidelying: Mod assist General bed mobility comments: HOB flat; mod A to powerup BLE onto bed. Discussed pt having a plan at home for who can assist with bed mobility.   Transfers Overall transfer level: Needs assistance Equipment used: Rolling walker (2 wheeled) Transfers: Sit to/from Stand Sit to Stand: Min guard         General transfer comment: from EOB and BSC    Balance Overall balance assessment: Needs assistance         Standing balance support: Bilateral upper extremity supported;During functional activity Standing balance-Leahy Scale: Fair Standing balance comment: able to static  stand and ambulate short distances in room without rw with no LOB                            ADL Overall ADL's : Needs assistance/impaired Eating/Feeding: Set up;Sitting   Grooming: Supervision/safety;Cueing for compensatory techniques;Standing   Upper Body Bathing: Set up;Sitting;Cueing for compensatory techniques   Lower Body Bathing: Minimal assistance;With adaptive equipment;Cueing for compensatory techniques;Sit to/from stand   Upper Body Dressing : Set up;Sitting   Lower Body Dressing: Minimal assistance;With adaptive equipment;Cueing for compensatory techniques;Sit to/from stand   Toilet Transfer: Supervision/safety;BSC;RW   Toileting- Architect and Hygiene: Moderate assistance;Sit to/from stand Toileting - Architect Details (indicate cue type and reason): educated on AE Tub/ Shower Transfer: Min guard;Ambulation;3 in 1;Rolling walker   Functional mobility during ADLs: Supervision/safety;Rolling walker General ADL Comments: Pt completed community distance functional mobility at min guard level with rw. Pt completed toilet transfer as detailed above. Pt educated on AE and compemsatory strategies for ADLs with back precautions. Answered and problem-solved through multiple ADL scenarios pt mentioned during session. Reinforced importance of following BAT precautions. Discussed safety with home setup at home.     Vision     Perception     Praxis      Pertinent Vitals/Pain Pain Assessment: 0-10 Pain Score: 7  Pain Location: back, surgical site Pain Descriptors / Indicators: Sore Pain Intervention(s): Limited activity within patient's tolerance;Monitored during session;Repositioned;RN gave pain meds during session     Hand Dominance Right   Extremity/Trunk Assessment Upper Extremity Assessment Upper Extremity Assessment: Overall WFL for tasks assessed  Lower Extremity Assessment Lower Extremity Assessment: Defer to PT evaluation        Communication Communication Communication: No difficulties   Cognition Arousal/Alertness: Awake/alert Behavior During Therapy: WFL for tasks assessed/performed Overall Cognitive Status: Within Functional Limits for tasks assessed                     General Comments       Exercises       Shoulder Instructions      Home Living Family/patient expects to be discharged to:: Private residence Living Arrangements: Spouse/significant other Available Help at Discharge: Family;Available PRN/intermittently Type of Home: House Home Access: Stairs to enter Entrance Stairs-Number of Steps: 1   Home Layout: One level     Bathroom Shower/Tub: Walk-in shower;Other (comment) (pt reports small walk-in shower)   Bathroom Toilet: Handicapped height     Home Equipment: Walker - 2 wheels;Crutches;Toilet riser;Grab bars - toilet          Prior Functioning/Environment Level of Independence: Needs assistance        Comments: Was not walking much prior to surgery.    OT Diagnosis: Acute pain   OT Problem List: Impaired balance (sitting and/or standing);Decreased knowledge of use of DME or AE;Decreased knowledge of precautions;Decreased safety awareness;Pain;Obesity   OT Treatment/Interventions: Self-care/ADL training;DME and/or AE instruction;Therapeutic activities;Patient/family education;Balance training    OT Goals(Current goals can be found in the care plan section) Acute Rehab OT Goals Patient Stated Goal: Get better, return to being active. OT Goal Formulation: With patient Time For Goal Achievement: 02/16/15 Potential to Achieve Goals: Good ADL Goals Pt Will Perform Grooming: with modified independence;standing Pt Will Perform Lower Body Bathing: with modified independence;with adaptive equipment;sit to/from stand Pt Will Perform Lower Body Dressing: with modified independence;with adaptive equipment;sit to/from stand Pt Will Transfer to Toilet: with modified  independence;ambulating;bedside commode Pt Will Perform Toileting - Clothing Manipulation and hygiene: with modified independence;with adaptive equipment;sit to/from stand Pt Will Perform Tub/Shower Transfer: Shower transfer;with supervision;ambulating;shower seat;3 in 1;rolling walker Additional ADL Goal #1: Pt will complete bed mobiliy at min guard level to prepare for OOB ADLs.   OT Frequency: Min 2X/week   Barriers to D/C:            Co-evaluation              End of Session Equipment Utilized During Treatment: Rolling walker;Back brace  Activity Tolerance: Patient tolerated treatment well Patient left: in bed;with call bell/phone within reach;with bed alarm set   Time: 1610-9604 OT Time Calculation (min): 41 min Charges:  OT General Charges $OT Visit: 1 Procedure OT Evaluation $Initial OT Evaluation Tier I: 1 Procedure OT Treatments $Self Care/Home Management : 23-37 mins G-Codes:    Pilar Grammes 2015/03/10, 10:56 AM

## 2015-02-09 NOTE — Progress Notes (Signed)
Patient bladder scanned earlyer was only showing 99 ml increased fluids and activity and he urinated about 1300, will continue to monitor. He is ambulating with brace and minimal in hall and in room.

## 2015-02-09 NOTE — Clinical Social Work Note (Signed)
CSW Consult Acknowledged:   CSW received a consult for SNF placement. Per PT's evaluation the appropriate level of care is outpatient PT. CSW will sign off.        Dysheka Bibbs, MSW, LCSWA [phone]  

## 2015-02-09 NOTE — Progress Notes (Signed)
Physical Therapy Treatment Patient Details Name: Gregory Sandoval MRN: [id number] DOB: [birth year] Today's Date: 02/09/2015    History of Present Illness S/p L4-L5 decompressive laminectomy decompression of L4 and L5 nerve roots on 02/08/15    PT Comments    Pt progressing towards physical therapy goals. Was able to perform transfers and ambulation at a gross supervision level (min assist for LE elevation into bed). Pt with poor recall of precautions, and spent an increased amount of time reviewing precautions, answering pt questions regarding precautions, and discussing appropriate posture/form for various home situations. Overall feel that pt will progress well at home, but may benefit from continued therapy at the outpatient level.   Follow Up Recommendations  Outpatient PT;Supervision for mobility/OOB     Equipment Recommendations  Rolling walker with 5" wheels    Recommendations for Other Services       Precautions / Restrictions Precautions Precautions: Back Precaution Booklet Issued: Yes (comment) Precaution Comments: Reviewed 3/3 back precautions multiple times with pt. Pt admits to having difficulty remembering. Required Braces or Orthoses: Spinal Brace Spinal Brace: Lumbar corset;Applied in sitting position Restrictions Weight Bearing Restrictions: No    Mobility  Bed Mobility Overal bed mobility: Needs Assistance Bed Mobility: Rolling;Sidelying to Sit;Sit to Sidelying Rolling: Supervision Sidelying to sit: Supervision     Sit to sidelying: Min assist General bed mobility comments: Assist to elevate LE's up into bed. Supervision for all other aspects of bed mobility.   Transfers Overall transfer level: Needs assistance Equipment used: None Transfers: Sit to/from Stand Sit to Stand: Modified independent (Device/Increase time)         General transfer comment: From EOB and chair. Good maintenance of back precautions and form.    Ambulation/Gait Ambulation/Gait assistance: Min guard Ambulation Distance (Feet): 600 Feet Assistive device: Rolling walker (2 wheeled) Gait Pattern/deviations: Step-through pattern;Decreased stride length Gait velocity: Decreased Gait velocity interpretation: Below normal speed for age/gender General Gait Details: No unsteadiness noted without AD. Pt able to ambulate at a decreased pace without LOB or slowing down during conversation. Pt maintained good form/posture.    Stairs            Wheelchair Mobility    Modified Rankin (Stroke Patients Only)       Balance Overall balance assessment: Needs assistance Sitting-balance support: Feet supported;No upper extremity supported Sitting balance-Leahy Scale: Fair     Standing balance support: No upper extremity supported;During functional activity Standing balance-Leahy Scale: Fair                      Cognition Arousal/Alertness: Awake/alert Behavior During Therapy: WFL for tasks assessed/performed Overall Cognitive Status: Within Functional Limits for tasks assessed       Memory: Decreased recall of precautions              Exercises      General Comments        Pertinent Vitals/Pain Pain Assessment: No/denies pain    Home Living                      Prior Function            PT Goals (current goals can now be found in the care plan section) Acute Rehab PT Goals Patient Stated Goal: Get better, return to being active. PT Goal Formulation: With patient Time For Goal Achievement: 02/22/15 Potential to Achieve Goals: Good Progress towards PT goals: Progressing toward goals    Frequency  Min 5X/week  PT Plan Current plan remains appropriate    Co-evaluation             End of Session Equipment Utilized During Treatment: Back brace Activity Tolerance: Patient tolerated treatment well Patient left: in chair;with call bell/phone within reach;with chair alarm set      Time: 1322-1409 PT Time Calculation (min) (ACUTE ONLY): 47 min  Charges:  $Gait Training: 23-37 mins $Therapeutic Activity: 8-22 mins                    G Codes:      Conni Slipper 02-20-2015, 2:20 PM   Conni Slipper, PT, DPT Acute Rehabilitation Services Pager: [phone]

## 2015-02-10 MED ORDER — DEXAMETHASONE 4 MG PO TABS
4.0000 mg | ORAL_TABLET | Freq: Every day | ORAL | Status: DC
  Administered 2015-02-10: 4 mg via ORAL
  Filled 2015-02-10: qty 1

## 2015-02-10 MED ORDER — OXYCODONE-ACETAMINOPHEN 5-325 MG PO TABS: 1.0000 | ORAL_TABLET | ORAL | Status: AC | PRN

## 2015-02-10 MED ORDER — DEXAMETHASONE 1 MG PO TABS: ORAL_TABLET | ORAL | Status: AC

## 2015-02-10 MED ORDER — METHOCARBAMOL 500 MG PO TABS: 500.0000 mg | ORAL_TABLET | Freq: Four times a day (QID) | ORAL | Status: AC | PRN

## 2015-02-10 NOTE — Discharge Summary (Signed)
Physician Discharge Summary  Patient ID: Gregory Sandoval MRN: [id number] DOB/AGE: Oct 15, 1975 39 y.o.  Admit date: 02/08/2015 Discharge date: 02/10/2015  Admission Diagnoses:spondylolisthesis L4-L5 with radiculopathy  Discharge Diagnoses: Spondylolisthesis L4-L5 with radiculopathy  Active Problems:   Spondylolisthesis at L4-L5 level   Discharged Condition: good  Hospital Course: Patient was admitted to undergo surgical decompression arthrodesis at L4-L5. He tolerated surgery well. Pain is under reasonable control.  Consults: None  Significant Diagnostic Studies: None  Treatments: surgery: He compression and fusion L4-L5 with posterior lumbar interbody technique and posterior lateral arthrodesis also  Discharge Exam: Blood pressure 132/74, pulse 93, temperature 98.4 F (36.9 C), temperature source Oral, resp. rate 18, height 6' (1.829 m), weight 123.322 kg (271 lb 14 oz), SpO2 96 %. Incision is clean and dry, motor function is intact. Station and gait are intact.  Disposition: Discharge home this afternoon  Discharge Instructions    Call MD for:  redness, tenderness, or signs of infection (pain, swelling, redness, odor or green/yellow discharge around incision site)    Complete by:  As directed      Call MD for:  severe uncontrolled pain    Complete by:  As directed      Call MD for:  temperature >100.4    Complete by:  As directed      Diet - low sodium heart healthy    Complete by:  As directed      Discharge instructions    Complete by:  As directed   Okay to shower. Do not apply salves or appointments to incision. No heavy lifting with the upper extremities greater than 15 pounds. May resume driving when not requiring pain medication and patient feels comfortable with doing so.     Increase activity slowly    Complete by:  As directed             Medication List    TAKE these medications        ALPRAZolam 1 MG tablet  Commonly known as:  XANAX  Take 1 mg by mouth  3 (three) times daily as needed for anxiety.     dexamethasone 1 MG tablet  Commonly known as:  DECADRON  2 tablets twice daily for 2 days, one tablet twice daily for 2 days, one tablet daily for 2 days.     HYDROcodone-acetaminophen 10-325 MG tablet  Commonly known as:  NORCO  Take 1-2 tablets by mouth every 6 (six) hours as needed for moderate pain.     methocarbamol 500 MG tablet  Commonly known as:  ROBAXIN  Take 1 tablet (500 mg total) by mouth every 6 (six) hours as needed for muscle spasms.     MULTIVITAMIN PO  Take 1 tablet by mouth daily.     omeprazole 20 MG capsule  Commonly known as:  PRILOSEC  Take 20 mg by mouth daily.     oxyCODONE-acetaminophen 5-325 MG tablet  Commonly known as:  PERCOCET/ROXICET  Take 1-2 tablets by mouth every 4 (four) hours as needed for moderate pain.     VITAMIN C ADULT GUMMIES PO  Take 1 tablet by mouth 2 (two) times daily.         SignedStefani Dama 02/10/2015, 8:58 AM

## 2015-02-10 NOTE — Progress Notes (Signed)
Physical Therapy Treatment Patient Details Name: Gregory Sandoval MRN: [id number] DOB: 10-22-1975 Today's Date: 02/10/2015    History of Present Illness S/p L4-L5 decompressive laminectomy decompression of L4 and L5 nerve roots on 02/08/15    PT Comments    Good progress towards functional goals. Did not require physical assist for bed mobility or transfers, only extra time. Stable with ambulation, not using an assistive device today. Reports increase in pain, RN notified pt requesting pain medication. Adequate for d/c from PT standpoint when medically ready.  Follow Up Recommendations  Supervision for mobility/OOB;No PT follow up     Equipment Recommendations  Rolling walker with 5" wheels    Recommendations for Other Services       Precautions / Restrictions Precautions Precautions: Back Precaution Comments: Recalls 3/3 back precautions Required Braces or Orthoses: Spinal Brace Spinal Brace: Lumbar corset;Applied in sitting position Restrictions Weight Bearing Restrictions: No    Mobility  Bed Mobility Overal bed mobility: Modified Independent             General bed mobility comments: Extra time. No physical assist. Maintains back precautions. Heavy reliance on rail initially  Transfers Overall transfer level: Needs assistance Equipment used: None Transfers: Sit to/from Stand Sit to Stand: Supervision         General transfer comment: VC for awareness of precautions with transition to stand from slightly elevated bed setting and from reclining chair with cues for hand placement. No physical assist needed. Slow to rise, and guarded but stable.  Ambulation/Gait Ambulation/Gait assistance: Supervision Ambulation Distance (Feet): 100 Feet Assistive device: Rolling walker (2 wheeled);None Gait Pattern/deviations: Step-through pattern;Decreased stride length Gait velocity: Decreased Gait velocity interpretation: Below normal speed for age/gender General Gait  Details: Guarded but stable without loss of balance. Distance limited due to pain today which increased with distance. Cues for energy conservation and larger step length.   Stairs Stairs:  (States he has no step to enter home)          Engineer, drilling Rankin (Stroke Patients Only)       Balance                                    Cognition Arousal/Alertness: Awake/alert Behavior During Therapy: Anxious Overall Cognitive Status: Within Functional Limits for tasks assessed                      Exercises      General Comments        Pertinent Vitals/Pain Pain Assessment: 0-10 Pain Score:  ("It's getting worse as I walk." no value given) Pain Location: back Pain Descriptors / Indicators: Sharp Pain Intervention(s): Monitored during session;Repositioned;Limited activity within patient's tolerance    Home Living                      Prior Function            PT Goals (current goals can now be found in the care plan section) Acute Rehab PT Goals Patient Stated Goal: Get better, return to being active. PT Goal Formulation: With patient Time For Goal Achievement: 02/22/15 Potential to Achieve Goals: Good Progress towards PT goals: Progressing toward goals    Frequency  Min 5X/week    PT Plan Current plan remains appropriate    Co-evaluation  End of Session Equipment Utilized During Treatment: Back brace Activity Tolerance: Patient tolerated treatment well Patient left: in chair;with call bell/phone within reach     Time: 1003-1025 PT Time Calculation (min) (ACUTE ONLY): 22 min  Charges:  $Gait Training: 8-22 mins                    G Codes:      Berton Mount 18-Feb-2015, 10:44 AM Charlsie Merles, PT [phone]

## 2015-02-10 NOTE — Progress Notes (Signed)
Patient ID: Gregory Sandoval   DOB: Mar 25, 1976, 39 y.o.   MRN: [id number] Vital signs are stable Motor function is intact This had markedly increased pain last night We'll add a course of Decadron for taper May be ready for discharge this afternoon We'll tentatively write orders for such Okay to shower without brace on

## 2015-02-10 NOTE — Progress Notes (Signed)
Patient being discharged home with wife prescriptions reviewed discharge instructions reviewed and agreed to, home equipment provided and reviewed. Pain controled he is alert and oriented he has all his positions.

## 2015-02-10 NOTE — Care Management Note (Signed)
Case Management Note  Patient Details  Name: Gregory Sandoval MRN: [id number] Date of Birth: 26-Mar-1976  Subjective/Objective:                    Action/Plan: Patient being discharged home today. Patient to be discharged with a 3 in 1 and a rolling walker. CM met with the patient at the bedside to discuss the DME. Jermaine with [employer] notified and equipment to be delivered to the room. Patient also expressing concern about being discharged late today and needing paper prescriptions for a 24 hour pharmacy. CM spoke with patient's nurse and let him know of need for paper prescriptions. Bedside RN aware of equipment being delivered to room.  Expected Discharge Date:                  Expected Discharge Plan:  Home/Self Care  In-House Referral:     Discharge planning Services  CM Consult  Post Acute Care Choice:    Choice offered to:     DME Arranged:  3-N-1, Walker rolling DME Agency:  [place]:    [facility] Agency:     Status of Service:  In process, will continue to follow  Medicare Important Message Given:    Date Medicare IM Given:    Medicare IM give by:    Date Additional Medicare IM Given:    Additional Medicare Important Message give by:     If discussed at [place] of Stay Meetings, dates discussed:    Additional Comments:  Pollie Friar, RN 02/10/2015, 10:55 AM

## 2015-02-26 ENCOUNTER — Encounter (HOSPITAL_COMMUNITY): Payer: Self-pay | Admitting: Neurological Surgery

## 2017-06-29 IMAGING — CR DG LUMBAR SPINE 2-3V
1 series · 1 of 1 positions shown · non-contrast
Comparison: No prior exams are available for comparison.

CLINICAL DATA: L4-5 PLIF.  Surgery, elective.

EXAM:
LUMBAR SPINE - 2-3 VIEW

[xtable lateral]
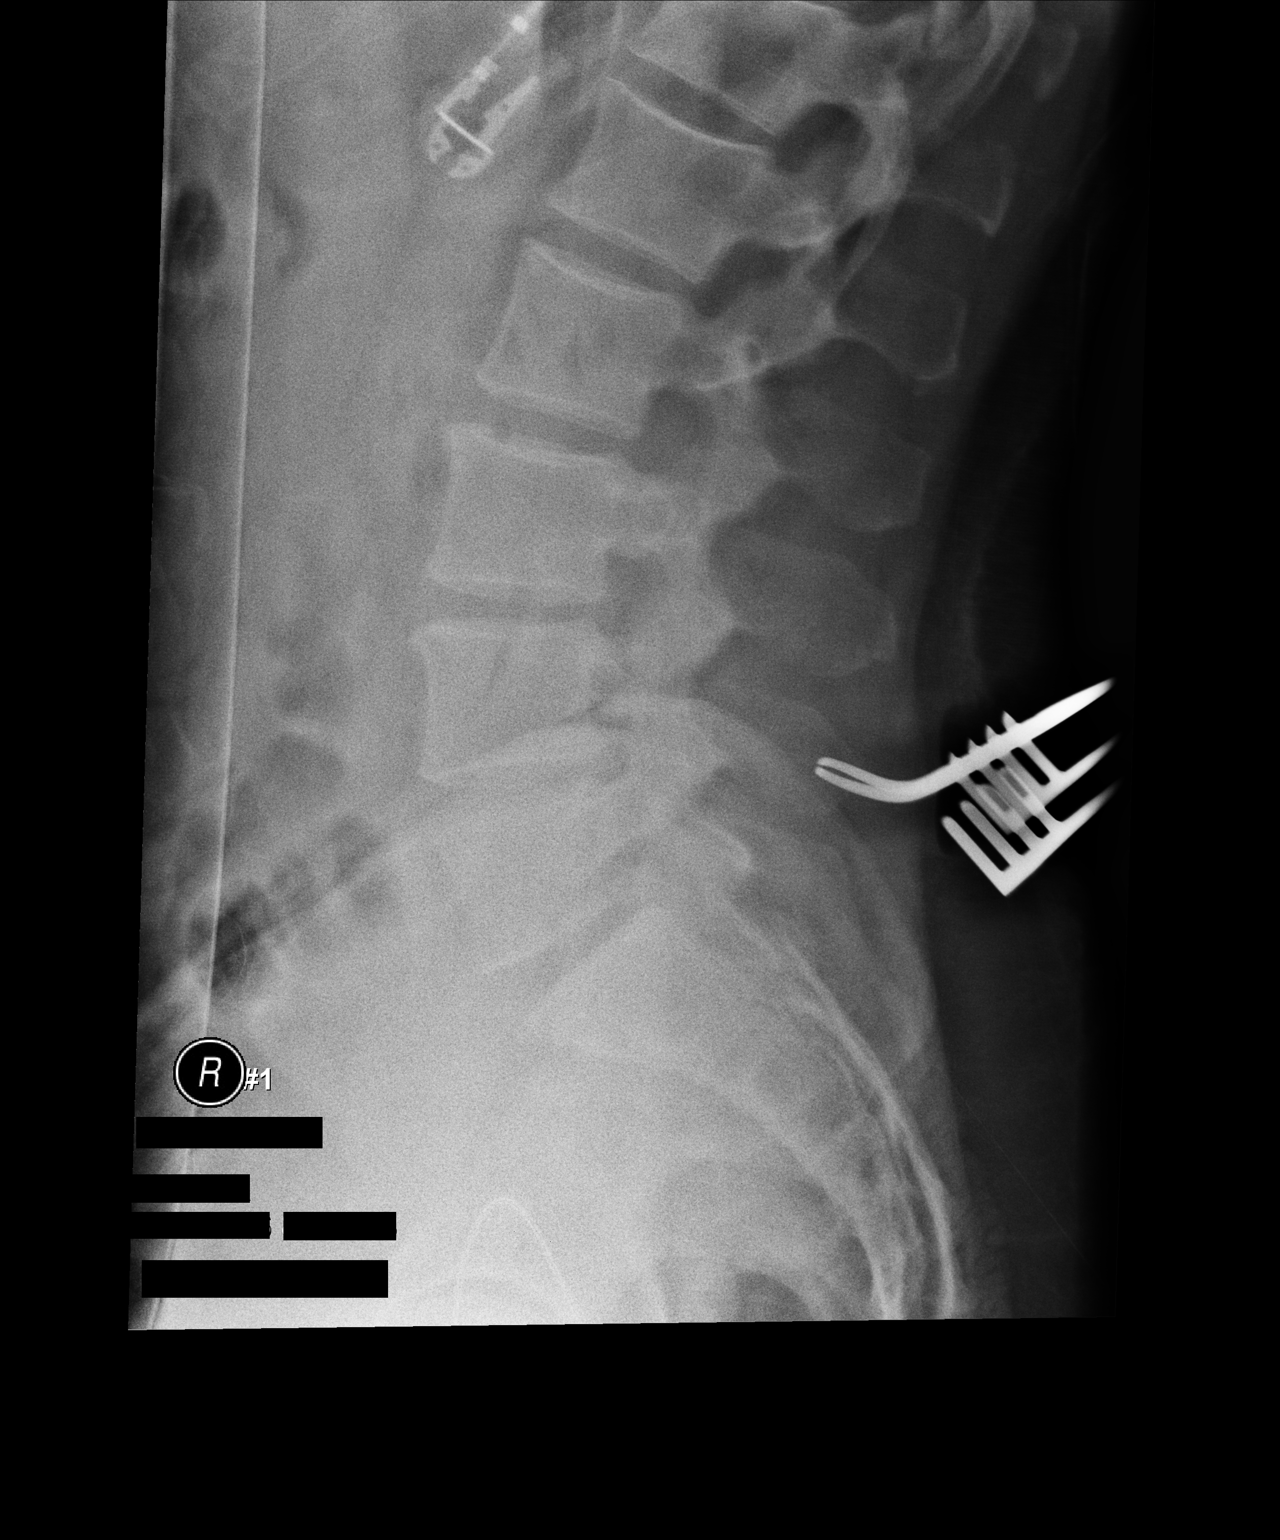

[1 of 1 positions shown; findings below may reference images not displayed]

FINDINGS: There are no prior examinations available for comparison. 2
intraoperative cross-table lateral views of the lumbar spine are
submitted. The first image, taken at 6545 hours, shows a surgical
instrument tip projecting over the L4 spinous process with retractor
in place.

The second image, taken at 5805 hours, shows a surgical instrument
tip projecting over the L4-5 interspace.
IMPRESSION: Intraoperative localization for L4-5 PLIF.
